# Patient Record
Sex: Male | Born: 2000 | Race: White | Hispanic: No | Marital: Single | Smoking: Never smoker
Health system: Southern US, Community
[De-identification: ages and names within clinical notes are randomized; demographics above are authoritative.]

## PROBLEM LIST (undated history)

## (undated) DIAGNOSIS — F909 Attention-deficit hyperactivity disorder, unspecified type: Secondary | ICD-10-CM

## (undated) HISTORY — PX: TONSILLECTOMY: SUR1361

---
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
## 2019-04-16 ENCOUNTER — Ambulatory Visit: Payer: Self-pay | Attending: Internal Medicine

## 2019-04-16 DIAGNOSIS — Z20822 Contact with and (suspected) exposure to covid-19: Secondary | ICD-10-CM | POA: Insufficient documentation

## 2019-04-17 ENCOUNTER — Ambulatory Visit: Payer: Self-pay

## 2019-04-17 LAB — NOVEL CORONAVIRUS, NAA: SARS-CoV-2, NAA: NOT DETECTED

## 2020-02-19 ENCOUNTER — Other Ambulatory Visit: Payer: Self-pay

## 2020-02-19 DIAGNOSIS — Z20822 Contact with and (suspected) exposure to covid-19: Secondary | ICD-10-CM

## 2020-02-21 LAB — NOVEL CORONAVIRUS, NAA: SARS-CoV-2, NAA: NOT DETECTED

## 2020-02-21 LAB — SARS-COV-2, NAA 2 DAY TAT

## 2020-03-05 ENCOUNTER — Other Ambulatory Visit: Payer: Self-pay

## 2021-02-15 ENCOUNTER — Other Ambulatory Visit (HOSPITAL_COMMUNITY): Payer: Self-pay | Admitting: Internal Medicine

## 2021-02-15 ENCOUNTER — Other Ambulatory Visit: Payer: Self-pay

## 2021-02-15 ENCOUNTER — Ambulatory Visit (HOSPITAL_BASED_OUTPATIENT_CLINIC_OR_DEPARTMENT_OTHER)
Admission: RE | Admit: 2021-02-15 | Discharge: 2021-02-15 | Disposition: A | Discharge status: Home / Self Care | Payer: No Typology Code available for payment source | Source: Ambulatory Visit | Attending: Internal Medicine | Admitting: Internal Medicine

## 2021-02-15 DIAGNOSIS — R112 Nausea with vomiting, unspecified: Secondary | ICD-10-CM | POA: Diagnosis present

## 2021-02-15 DIAGNOSIS — R1011 Right upper quadrant pain: Secondary | ICD-10-CM

## 2022-02-28 IMAGING — US US ABDOMEN LIMITED
1 series · 14 of 25 positions shown · non-contrast
Comparison: None

CLINICAL DATA: RIGHT upper quadrant pain in a 20-year-old male.

EXAM:
ULTRASOUND ABDOMEN LIMITED RIGHT UPPER QUADRANT

[Series 1: us abdomen limited ruq (liver/gb) · 14 of 39 slices shown]
[im 1/39]
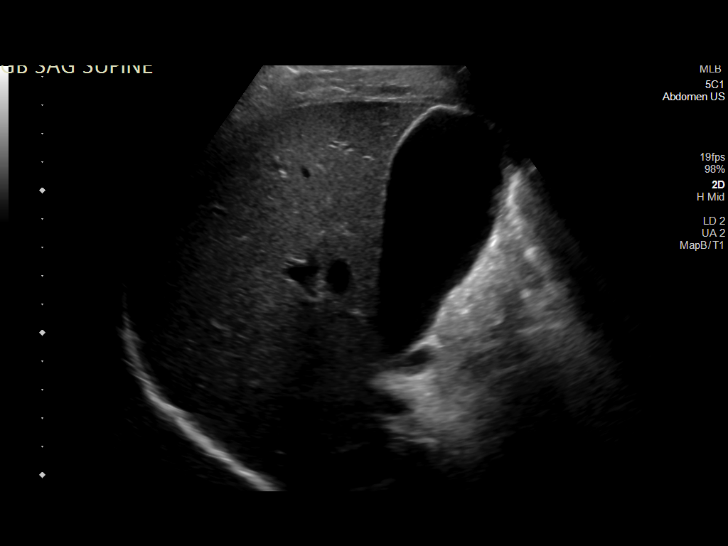
[im 4/39]
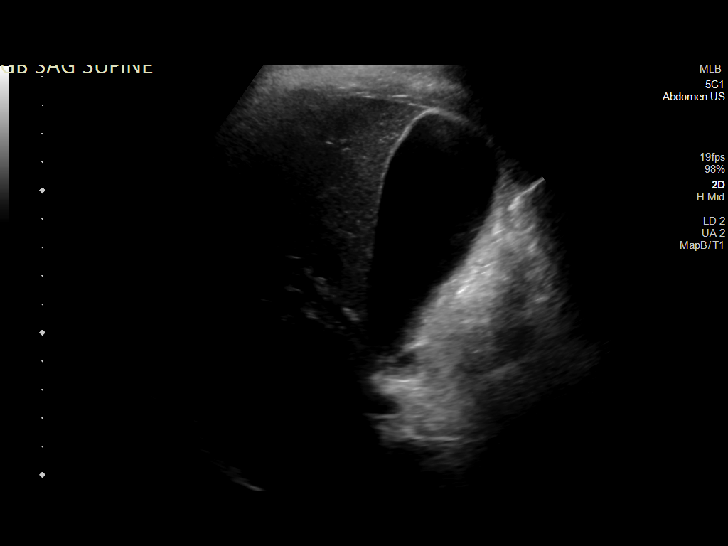
[im 7/39]
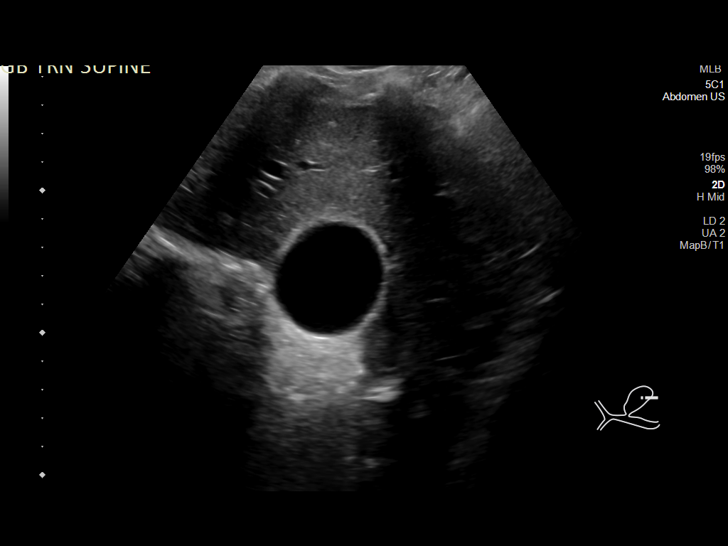
[im 10/39]
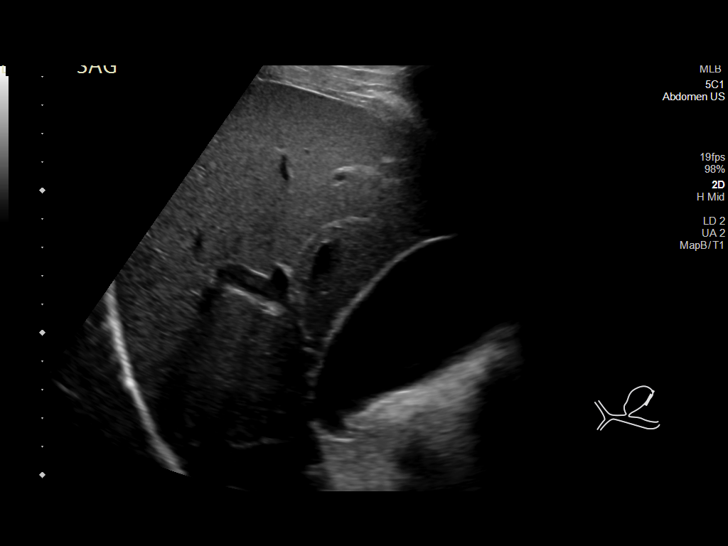
[im 13/39]
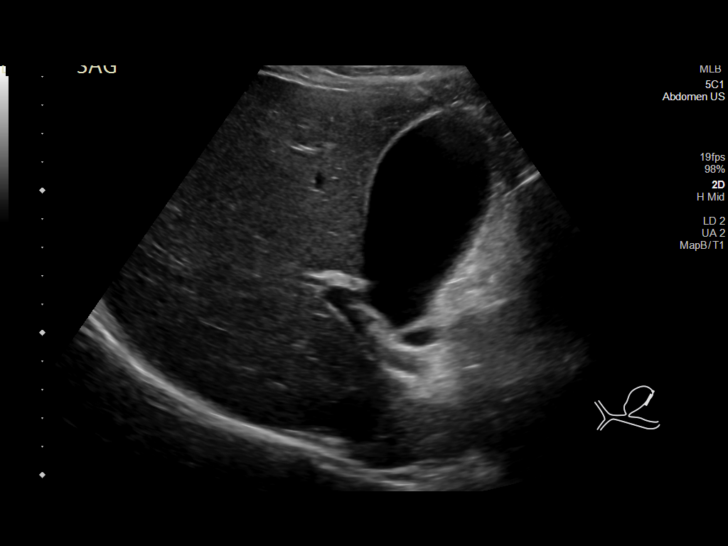
[im 15/39]
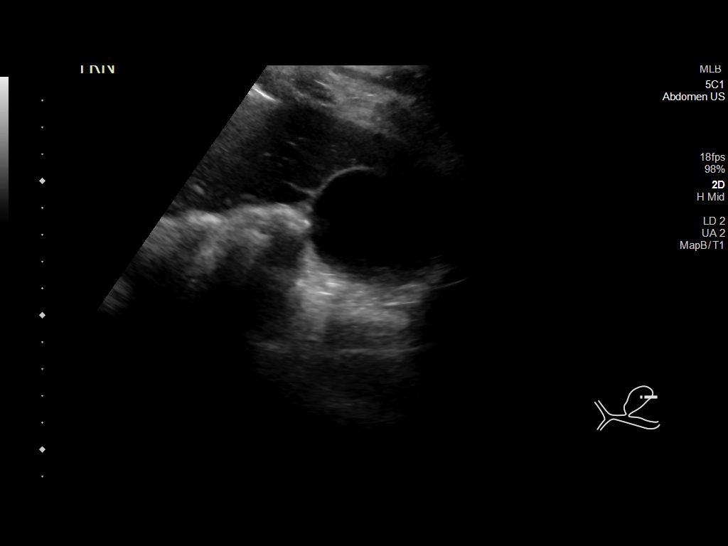
[im 18/39]
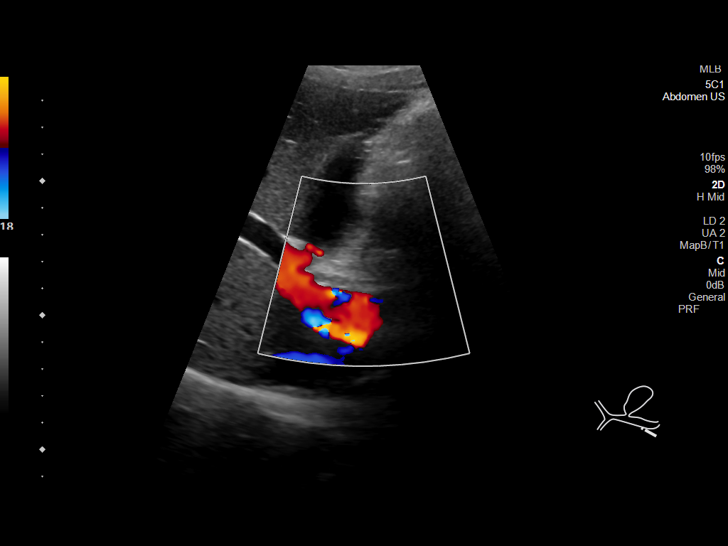
[im 21/39]
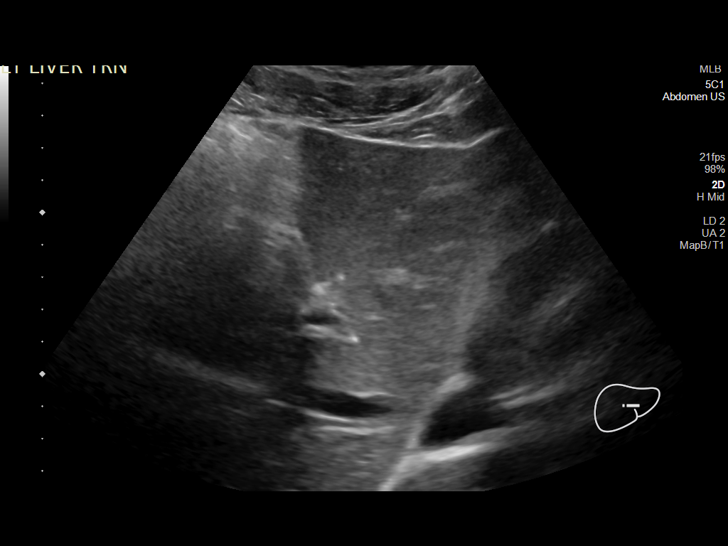
[im 24/39]
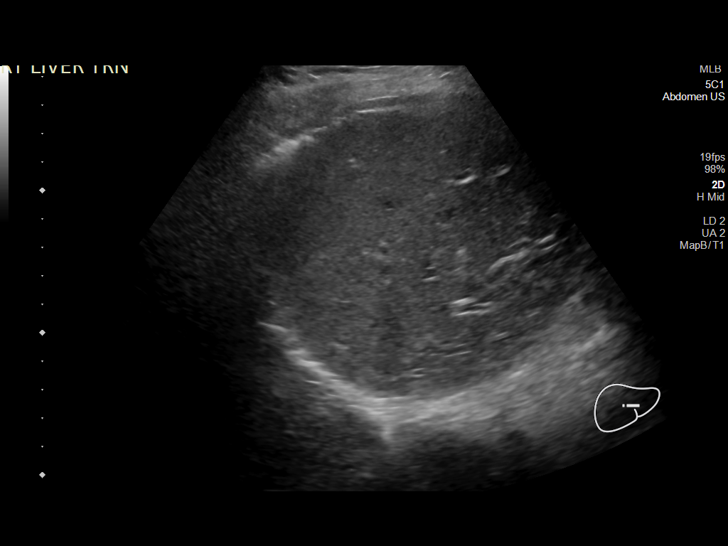
[im 26/39]
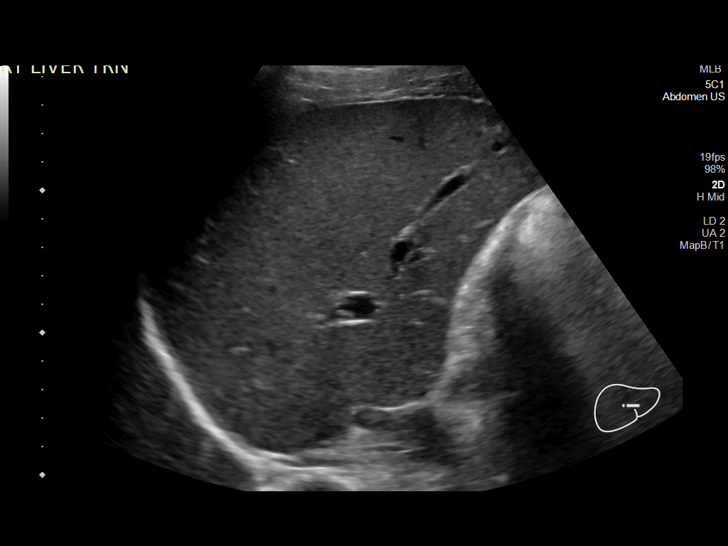
[im 29/39]
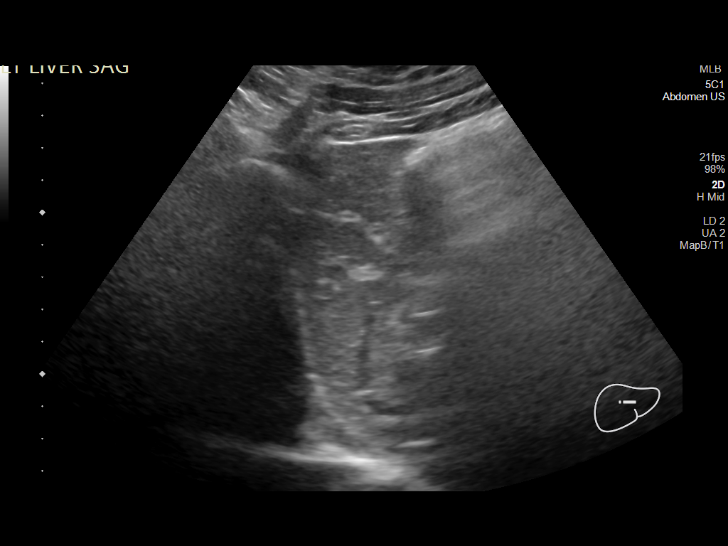
[im 32/39]
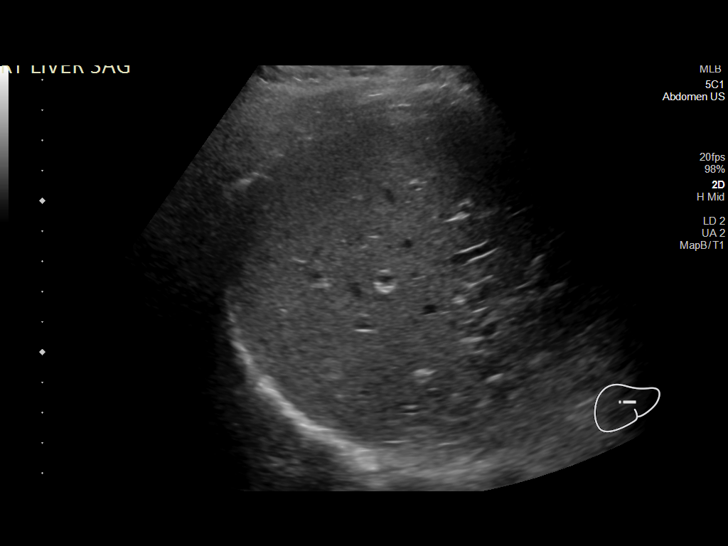
[im 35/39]
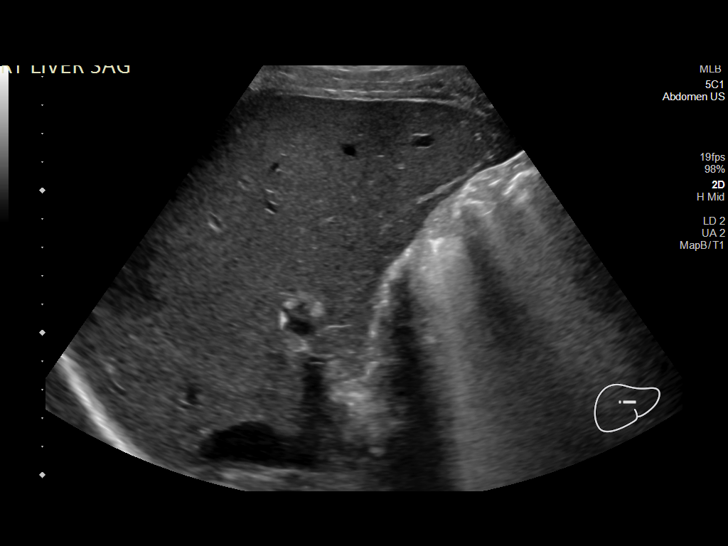
[im 39/39]
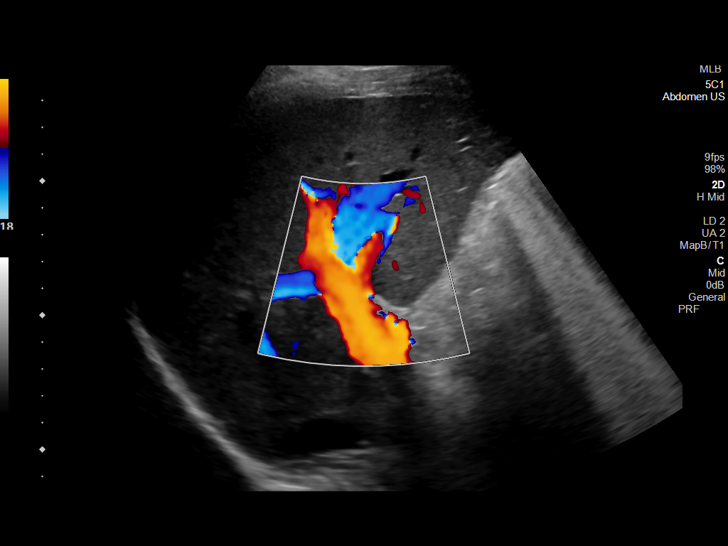

[14 of 25 positions shown; findings below may reference images not displayed]

FINDINGS: Gallbladder:

No gallstones or wall thickening visualized. No sonographic Murphy
sign noted by sonographer.

Common bile duct:

Diameter: 4 mm

Liver:

No focal lesion identified. Within normal limits in parenchymal
echogenicity. Portal vein is patent on color Doppler imaging with
normal direction of blood flow towards the liver.

Other: None.
IMPRESSION: Normal RIGHT upper quadrant ultrasound.
